# Patient Record
Sex: Female | Born: 1995 | Race: White | Hispanic: No | Marital: Single | Smoking: Never smoker
Health system: Southern US, Community
[De-identification: ages and names within clinical notes are randomized; demographics above are authoritative.]

## PROBLEM LIST (undated history)

## (undated) DIAGNOSIS — Z8739 Personal history of other diseases of the musculoskeletal system and connective tissue: Secondary | ICD-10-CM

## (undated) DIAGNOSIS — J309 Allergic rhinitis, unspecified: Secondary | ICD-10-CM

## (undated) DIAGNOSIS — B019 Varicella without complication: Secondary | ICD-10-CM

## (undated) HISTORY — DX: Varicella without complication: B01.9

## (undated) HISTORY — PX: TONSILLECTOMY AND ADENOIDECTOMY: SHX28

## (undated) HISTORY — DX: Personal history of other diseases of the musculoskeletal system and connective tissue: Z87.39

## (undated) HISTORY — DX: Allergic rhinitis, unspecified: J30.9

---
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
## 2014-06-24 ENCOUNTER — Telehealth: Payer: Self-pay | Admitting: *Deleted

## 2014-06-24 NOTE — Telephone Encounter (Signed)
Patient informed of Dr.Knapp's response. She would like some time to think on it. She will call me back either way and let me know and I can schedule her either here or with GYN, her preference.

## 2014-06-24 NOTE — Telephone Encounter (Signed)
Patient called and was requesting a referral to an OB/GYN, states she will be 19 this month and assumes she should establish with one. Please advise.

## 2014-06-24 NOTE — Telephone Encounter (Signed)
Advise pt she does not need to establish with GYN just based on age.  I can do her pelvic and breast exams.  Paps aren't needed until age 19.  If she PREFERS to see GYN, then fine to refer (usually referral is NOT necessary). We can schedule for CPE here if she desires

## 2015-09-23 ENCOUNTER — Encounter: Payer: Self-pay | Admitting: Family Medicine

## 2015-09-23 ENCOUNTER — Ambulatory Visit (INDEPENDENT_AMBULATORY_CARE_PROVIDER_SITE_OTHER): Payer: Managed Care, Other (non HMO) | Admitting: Family Medicine

## 2015-09-23 ENCOUNTER — Ambulatory Visit: Payer: Self-pay | Admitting: Family Medicine

## 2015-09-23 VITALS — BP 110/70 | HR 64 | Wt 211.2 lb

## 2015-09-23 DIAGNOSIS — Q386 Other congenital malformations of mouth: Secondary | ICD-10-CM | POA: Diagnosis not present

## 2015-09-23 DIAGNOSIS — L600 Ingrowing nail: Secondary | ICD-10-CM | POA: Diagnosis not present

## 2015-09-23 NOTE — Patient Instructions (Addendum)
Fordyce spots.   Continue with soaks for ingrown toenail. Referral made to [facility].    Ingrown Toenail An ingrown toenail occurs when the corner or sides of your toenail grow into the surrounding skin. The big toe is most commonly affected, but it can happen to any of your toes. If your ingrown toenail is not treated, you will be at risk for infection. CAUSES This condition may be caused by:  Wearing shoes that are too small or tight.  Injury or trauma, such as stubbing your toe or having your toe stepped on.  Improper cutting or care of your toenails.  Being born with (congenital) nail or foot abnormalities, such as having a nail that is too big for your toe. RISK FACTORS Risk factors for an ingrown toenail include:  Age. Your nails tend to thicken as you get older, so ingrown nails are more common in older people.  Diabetes.  Cutting your toenails incorrectly.  Blood circulation problems. SYMPTOMS Symptoms may include:  Pain, soreness, or tenderness.  Redness.  Swelling.  Hardening of the skin surrounding the toe. Your ingrown toenail may be infected if there is fluid, pus, or drainage. DIAGNOSIS  An ingrown toenail may be diagnosed by medical history and physical exam. If your toenail is infected, your health care provider may test a sample of the drainage. TREATMENT Treatment depends on the severity of your ingrown toenail. Some ingrown toenails may be treated at home. More severe or infected ingrown toenails may require surgery to remove all or part of the nail. Infected ingrown toenails may also be treated with antibiotic medicines. HOME CARE INSTRUCTIONS  If you were prescribed an antibiotic medicine, finish all of it even if you start to feel better.  Soak your foot in warm soapy water for 20 minutes, 3 times per day or as directed by your health care provider.  Carefully lift the edge of the nail away from the sore skin by wedging a small piece of  cotton under the corner of the nail. This may help with the pain. Be careful not to cause more injury to the area.  Wear shoes that fit well. If your ingrown toenail is causing you pain, try wearing sandals, if possible.  Trim your toenails regularly and carefully. Do not cut them in a curved shape. Cut your toenails straight across. This prevents injury to the skin at the corners of the toenail.  Keep your feet clean and dry.  If you are having trouble walking and are given crutches by your health care provider, use them as directed.  Do not pick at your toenail or try to remove it yourself.  Take medicines only as directed by your health care provider.  Keep all follow-up visits as directed by your health care provider. This is important. SEEK MEDICAL CARE IF:  Your symptoms do not improve with treatment. SEEK IMMEDIATE MEDICAL CARE IF:  You have red streaks that start at your foot and go up your leg.  You have a fever.  You have increased redness, swelling, or pain.  You have fluid, blood, or pus coming from your toenail.   This information is not intended to replace advice given to you by your health care provider. Make sure you discuss any questions you have with your health care provider.   Document Released: 05/28/2000 Document Revised: 10/15/2014 Document Reviewed: 04/24/2014 Elsevier Interactive Patient Education [employer]2016 Elsevier Inc.

## 2015-09-23 NOTE — Progress Notes (Signed)
   Subjective:    Patient ID: Heather Shelton, female    DOB: [birth year], 20 y.o.   MRN: [id number]  HPI Chief Complaint  Patient presents with  . multiple    multiple issues. ingrown toenail- left big toe, and vaginal exam- white spots on inside of vaginal- no problems   She is here with multiple complaints. She reports that she has had an ingrown toenail to left great toe for over a year and would like to have this removed today. States it is not infected but is tired of it hurting when she bumps it or is doing martial arts. States she has tried soaking it and has been doing this daily but no improvement.   She states she also has white spots on her labia that have been present for several months and have not changed.  States she is not sexually active, has never had sexual intercourse.   Denies fever, chills, nausea, vomiting.   LMP: states is due tomorrow.  Denies history of STI.     Review of Systems Pertinent positives and negatives in the history of present illness.     Objective:   Physical Exam BP 110/70 mmHg  Pulse 64  Wt 211 lb 3.2 oz (95.8 kg)  Left great toenail with granulation tissue to left side of nail, no erythema, warmth or sign of infection. Left foot exam normal otherwise.  External genitalia exam reveals labia minora with small whitish lumps approximately 1 mm in size, symmetric, non tender. No erythema or lesions. Internal exam not performed.       Assessment & Plan:  Ingrown toenail without infection  Fordyce spots  Discussed that I will refer her to [facility] for further evaluation of ingrown toenail and possible removal. Also discussed that the spots on her labia are benign and a normal variant. Discussed that they are more prominent in some people than in others and reassured her that this is not something that is contagious.

## 2015-09-29 ENCOUNTER — Ambulatory Visit: Payer: Managed Care, Other (non HMO) | Admitting: Family Medicine

## 2015-09-29 ENCOUNTER — Ambulatory Visit: Payer: Self-pay | Admitting: Family Medicine

## 2015-12-17 ENCOUNTER — Ambulatory Visit (INDEPENDENT_AMBULATORY_CARE_PROVIDER_SITE_OTHER): Payer: Managed Care, Other (non HMO) | Admitting: Family Medicine

## 2015-12-17 ENCOUNTER — Encounter: Payer: Self-pay | Admitting: Family Medicine

## 2015-12-17 VITALS — BP 110/70 | HR 64 | Temp 98.4°F | Wt 218.6 lb

## 2015-12-17 DIAGNOSIS — M545 Low back pain, unspecified: Secondary | ICD-10-CM

## 2015-12-17 DIAGNOSIS — G8929 Other chronic pain: Secondary | ICD-10-CM | POA: Diagnosis not present

## 2015-12-17 MED ORDER — IBUPROFEN 800 MG PO TABS: 800.0000 mg | ORAL_TABLET | Freq: Three times a day (TID) | ORAL | Status: AC | PRN

## 2015-12-17 NOTE — Patient Instructions (Addendum)
Use heat 20 minutes at a time 2-3 times daily and try the stretches. Use good body mechanics as we discussed.  Take the 800 mg Ibuprofen 3 times daily for next 2 weeks.   Back Exercises The following exercises strengthen the muscles that help to support the back. They also help to keep the lower back flexible. Doing these exercises can help to prevent back pain or lessen existing pain. If you have back pain or discomfort, try doing these exercises 2-3 times each day or as told by your health care provider. When the pain goes away, do them once each day, but increase the number of times that you repeat the steps for each exercise (do more repetitions). If you do not have back pain or discomfort, do these exercises once each day or as told by your health care provider. EXERCISES Single Knee to Chest Repeat these steps 3-5 times for each leg: 1. Lie on your back on a firm bed or the floor with your legs extended. 2. Bring one knee to your chest. Your other leg should stay extended and in contact with the floor. 3. Hold your knee in place by grabbing your knee or thigh. 4. Pull on your knee until you feel a gentle stretch in your lower back. 5. Hold the stretch for 10-30 seconds. 6. Slowly release and straighten your leg. Pelvic Tilt Repeat these steps 5-10 times: 1. Lie on your back on a firm bed or the floor with your legs extended. 2. Bend your knees so they are pointing toward the ceiling and your feet are flat on the floor. 3. Tighten your lower abdominal muscles to press your lower back against the floor. This motion will tilt your pelvis so your tailbone points up toward the ceiling instead of pointing to your feet or the floor. 4. With gentle tension and even breathing, hold this position for 5-10 seconds. Cat-Cow Repeat these steps until your lower back becomes more flexible: 1. Get into a hands-and-knees position on a firm surface. Keep your hands under your shoulders, and keep your  knees under your hips. You may place padding under your knees for comfort. 2. Let your head hang down, and point your tailbone toward the floor so your lower back becomes rounded like the back of a cat. 3. Hold this position for 5 seconds. 4. Slowly lift your head and point your tailbone up toward the ceiling so your back forms a sagging arch like the back of a cow. 5. Hold this position for 5 seconds. Press-Ups Repeat these steps 5-10 times: 1. Lie on your abdomen (face-down) on the floor. 2. Place your palms near your head, about shoulder-width apart. 3. While you keep your back as relaxed as possible and keep your hips on the floor, slowly straighten your arms to raise the top half of your body and lift your shoulders. Do not use your back muscles to raise your upper torso. You may adjust the placement of your hands to make yourself more comfortable. 4. Hold this position for 5 seconds while you keep your back relaxed. 5. Slowly return to lying flat on the floor. Bridges Repeat these steps 10 times: 1. Lie on your back on a firm surface. 2. Bend your knees so they are pointing toward the ceiling and your feet are flat on the floor. 3. Tighten your buttocks muscles and lift your buttocks off of the floor until your waist is at almost the same height as your knees. You should  feel the muscles working in your buttocks and the back of your thighs. If you do not feel these muscles, slide your feet 1-2 inches farther away from your buttocks. 4. Hold this position for 3-5 seconds. 5. Slowly lower your hips to the starting position, and allow your buttocks muscles to relax completely. If this exercise is too easy, try doing it with your arms crossed over your chest. Abdominal Crunches Repeat these steps 5-10 times: 1. Lie on your back on a firm bed or the floor with your legs extended. 2. Bend your knees so they are pointing toward the ceiling and your feet are flat on the floor. 3. Cross your  arms over your chest. 4. Tip your chin slightly toward your chest without bending your neck. 5. Tighten your abdominal muscles and slowly raise your trunk (torso) high enough to lift your shoulder blades a tiny bit off of the floor. Avoid raising your torso higher than that, because it can put too much stress on your low back and it does not help to strengthen your abdominal muscles. 6. Slowly return to your starting position. Back Lifts Repeat these steps 5-10 times: 1. Lie on your abdomen (face-down) with your arms at your sides, and rest your forehead on the floor. 2. Tighten the muscles in your legs and your buttocks. 3. Slowly lift your chest off of the floor while you keep your hips pressed to the floor. Keep the back of your head in line with the curve in your back. Your eyes should be looking at the floor. 4. Hold this position for 3-5 seconds. 5. Slowly return to your starting position. SEEK MEDICAL CARE IF:  Your back pain or discomfort gets much worse when you do an exercise.  Your back pain or discomfort does not lessen within 2 hours after you exercise. If you have any of these problems, stop doing these exercises right away. Do not do them again unless your health care provider says that you can. SEEK IMMEDIATE MEDICAL CARE IF:  You develop sudden, severe back pain. If this happens, stop doing the exercises right away. Do not do them again unless your health care provider says that you can.   This information is not intended to replace advice given to you by your health care provider. Make sure you discuss any questions you have with your health care provider.   Document Released: 07/08/2004 Document Revised: 02/19/2015 Document Reviewed: 07/25/2014 Elsevier Interactive Patient Education [employer]2016 Elsevier Inc.

## 2015-12-17 NOTE — Progress Notes (Signed)
   Subjective:    Patient ID: Heather Shelton, female    DOB: [birth year], 20 y.o.   MRN: [id number]  HPI Chief Complaint  Patient presents with  . back pain    back pain- thought it was from picking up dog but not sure   She is here with complaints of a several months history of low back pain that hurts mostly when she bends forward. Denies injury or sudden onset of pain and states she has been picking up her large dog for about a year up until the past 2 months.  Pain is midline mostly, non radiating, worsens with sitting erect and with movement. Pain improved if she is resting or laying down.  She has been using heat and ice and stretches in distant months but nothing in past 2-3 months.  Denies history of back surgery or injury.  Has seen chiropractor for other issues but not for this.  Does not like to take anti-inflammatories.   Denies fever, chills, numbness, tingling, weakness, loss of control of bowels or bladder.   Reviewed allergies, medications, past medical, and surgical history.    Review of Systems Pertinent positives and negatives in the history of present illness.     Objective:   Physical Exam  Constitutional: She is oriented to person, place, and time. She appears well-developed and well-nourished. No distress.  Neck: Normal range of motion. Neck supple.  Cardiovascular: Normal rate, regular rhythm, normal heart sounds and normal pulses.   Pulmonary/Chest: Effort normal and breath sounds normal.  Abdominal: There is no CVA tenderness.  Musculoskeletal:       Lumbar back: She exhibits pain. She exhibits no tenderness, no bony tenderness, no swelling and no spasm.       Back:  Pain with flexion, extension, twisting bilaterally, no erythema, rash, bruising, spasm, non tender.   Neurological: She is alert and oriented to person, place, and time. She has normal strength and normal reflexes. No sensory deficit. Gait normal.  Negative straight leg raise  Skin:  Skin is warm and dry. No rash noted. No pallor.   BP 110/70 mmHg  Pulse 64  Temp(Src) 98.4 F (36.9 C) (Oral)  Wt 218 lb 9.6 oz (99.156 kg)      Assessment & Plan:  Chronic midline low back pain without sciatica - Plan: ibuprofen (ADVIL,MOTRIN) 800 MG tablet  Her symptoms appear to be related to musculoskeletal etiology. No history of chronic steroid use. She does not appear infectious. Discussed trying conservative therapy including a 2 week course of anti-inflammatories, prescription sent for 800 mg ibuprofen. Recommend using heat 20 minutes at a time 2-3 times daily and doing stretches that were discussed in the office. Back stretches printed and sent with patient. Discussed using good body mechanics. She will follow up if not improving. May consider physical therapy and/or imaging.

## 2016-01-13 ENCOUNTER — Ambulatory Visit (INDEPENDENT_AMBULATORY_CARE_PROVIDER_SITE_OTHER): Payer: Managed Care, Other (non HMO) | Admitting: Family Medicine

## 2016-01-13 ENCOUNTER — Other Ambulatory Visit: Payer: Self-pay

## 2016-01-13 ENCOUNTER — Ambulatory Visit
Admission: RE | Admit: 2016-01-13 | Discharge: 2016-01-13 | Disposition: A | Discharge status: Home / Self Care | Payer: Managed Care, Other (non HMO) | Source: Ambulatory Visit | Attending: Family Medicine | Admitting: Family Medicine

## 2016-01-13 VITALS — BP 112/72 | HR 68 | Temp 98.3°F | Wt 217.8 lb

## 2016-01-13 DIAGNOSIS — M5442 Lumbago with sciatica, left side: Secondary | ICD-10-CM | POA: Diagnosis not present

## 2016-01-13 DIAGNOSIS — M545 Low back pain, unspecified: Secondary | ICD-10-CM

## 2016-01-13 NOTE — Progress Notes (Signed)
   Subjective:    Patient ID: Heather Shelton, female    DOB: 1995-11-21, 20 y.o.   MRN: [id number]  HPI Chief Complaint  Patient presents with  . Back Pain    back pain- more on left side. having trouble bending or sitting.    She is here with complaints of at least a 6-8 week history of low back pain. Left low back pain that is now shooting down into left buttock since yesterday. States this occurs mainly with sitting and is limiting her ability to bend forward and interfering with her daily tasks.  Last seen for low back pain in early July. She took 2 weeks of Ibuprofen 800 mg tid, used heat 2-3 times daily and no relief. Has been doing stretches as well.   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  Was adjusted by chiropractor on 2 occasions since onset of back pain without relief.    States she is sleeping ok, pain not keeping her awake.   Denies fever, chills, abdominal pain, nausea, vomiting, diarrhea. No numbness, tingling, weakness. No bowel or bladder incontinence.   Reviewed allergies, medications, past medical, surgical history.    Review of Systems Pertinent positives and negatives in the history of present illness.     Objective:   Physical Exam  Constitutional: She is oriented to person, place, and time. She appears well-developed and well-nourished. No distress.  Neck: Normal range of motion. Neck supple.  Musculoskeletal:       Lumbar back: She exhibits decreased range of motion and pain. She exhibits no tenderness, no bony tenderness, no swelling and no spasm.       Back:  No erythema, edema, rash. Nontender. Pain with flexion, limited ROM. Negative straight leg raise. LE neurovascularly intact.   Neurological: She is alert and oriented to person, place, and time. She has normal strength and normal reflexes. Coordination and gait normal.  Skin: Skin is warm and dry. No rash noted. No pallor.   BP 112/72   Pulse 68   Temp 98.3 F (36.8  C) (Oral)   Wt 217 lb 12.8 oz (98.8 kg)   BMI 36.24 kg/m        Assessment & Plan:  Left-sided low back pain with left-sided sciatica  She has tried conservative therapy and appears to have worsening pain. She did not previously have sciatica but appears to have this today. XR ordered.  Does not want pain medication or steroids for this today. States pain is tolerable.  Discussed next step including possible PT or ortho referral.  Follow up pending XR result.

## 2016-01-15 ENCOUNTER — Telehealth: Payer: Self-pay | Admitting: Internal Medicine

## 2016-01-15 DIAGNOSIS — M545 Low back pain: Secondary | ICD-10-CM

## 2016-01-15 NOTE — Telephone Encounter (Signed)
Pt is scheduled with [facility] on 01/21/16 @ 9:30am with Dr. Zachery Dauer at [address], [place], [place] [address]

## 2018-02-08 IMAGING — CR DG LUMBAR SPINE COMPLETE 4+V
5 series · 5 of 5 positions shown · non-contrast
Comparison: None.

CLINICAL DATA: Lumbago

EXAM:
LUMBAR SPINE - COMPLETE 4+ VIEW

[t l-spine a.p.]
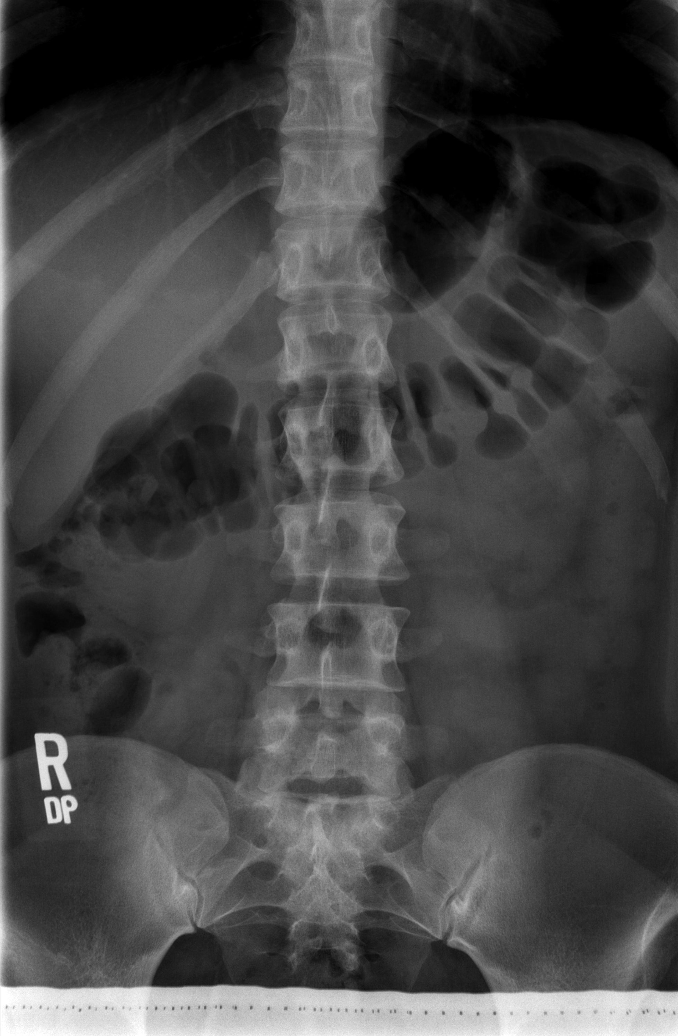

[t l-spine oblique exposure (1 of 2)]
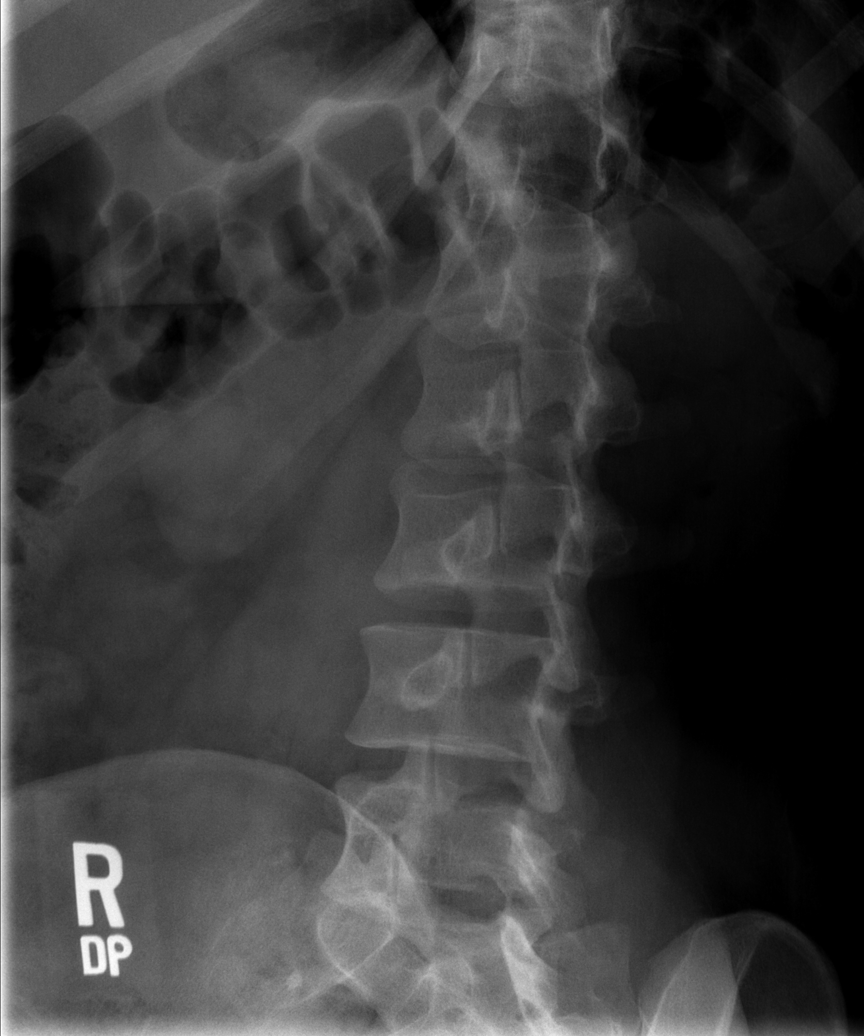

[t l-spine oblique exposure (2 of 2)]
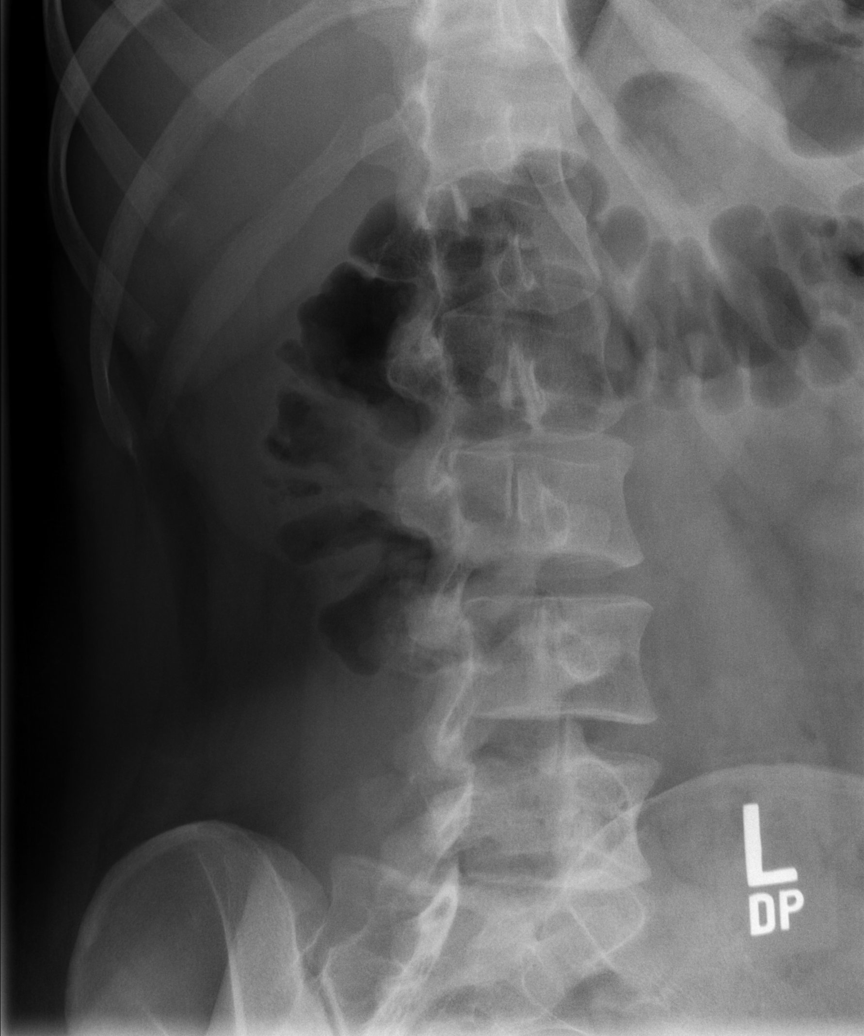

[t l-spine lat]
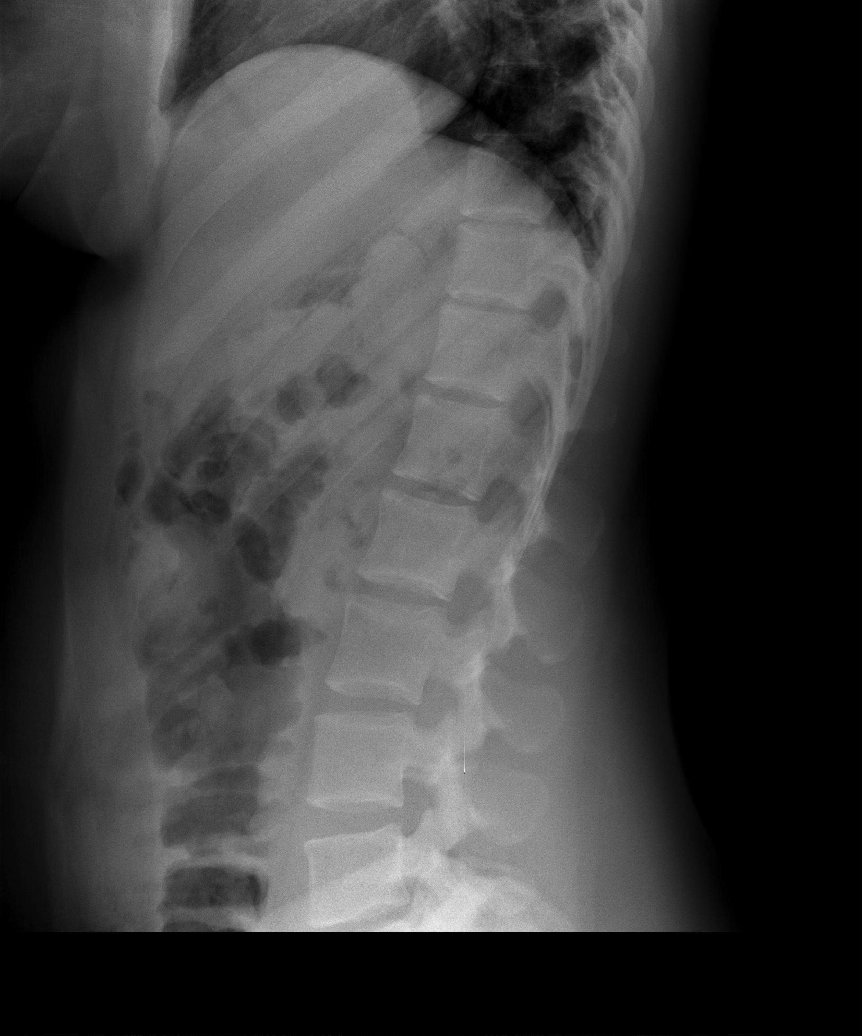

[t l-spine l5-s1 spot]
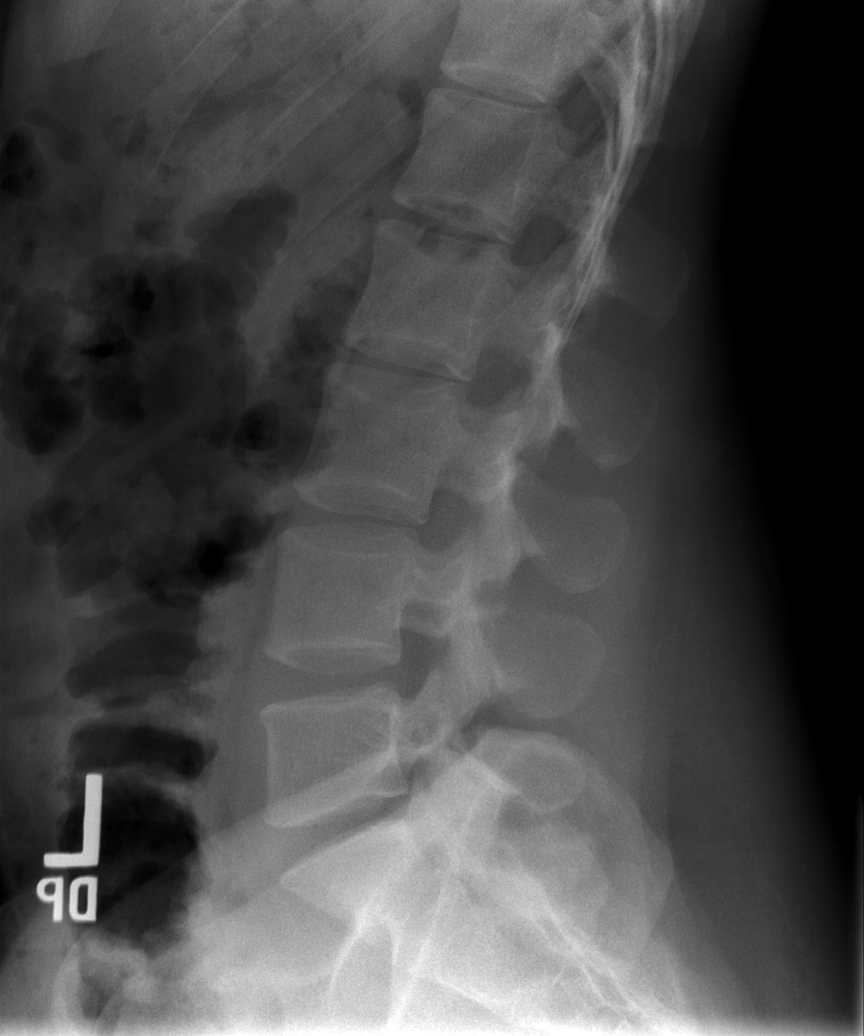

[5 of 5 positions shown; findings below may reference images not displayed]

FINDINGS: Frontal, lateral, spot lumbosacral lateral, and bilateral oblique
views were obtained. There are 5 non-rib-bearing lumbar type
vertebral bodies. There is no apparent fracture or
spondylolisthesis. The disc spaces appear normal. There is no
appreciable facet arthropathy.
IMPRESSION: No fracture or spondylolisthesis.  No apparent arthropathy.

## 2020-02-27 ENCOUNTER — Ambulatory Visit (INDEPENDENT_AMBULATORY_CARE_PROVIDER_SITE_OTHER): Payer: 59 | Admitting: Psychology

## 2020-02-27 DIAGNOSIS — F4322 Adjustment disorder with anxiety: Secondary | ICD-10-CM

## 2020-03-05 ENCOUNTER — Ambulatory Visit (INDEPENDENT_AMBULATORY_CARE_PROVIDER_SITE_OTHER): Payer: 59 | Admitting: Psychology

## 2020-03-05 DIAGNOSIS — F4322 Adjustment disorder with anxiety: Secondary | ICD-10-CM
